# Patient Record
Sex: Male | Born: 1975 | Race: Black or African American | Hispanic: No | Marital: Married | Smoking: Never smoker
Health system: Southern US, Community
[De-identification: ages and names within clinical notes are randomized; demographics above are authoritative.]

## PROBLEM LIST (undated history)

## (undated) DIAGNOSIS — T7840XA Allergy, unspecified, initial encounter: Secondary | ICD-10-CM

## (undated) DIAGNOSIS — G43909 Migraine, unspecified, not intractable, without status migrainosus: Secondary | ICD-10-CM

## (undated) HISTORY — DX: Migraine, unspecified, not intractable, without status migrainosus: G43.909

## (undated) HISTORY — DX: Allergy, unspecified, initial encounter: T78.40XA

## (undated) HISTORY — PX: HERNIA REPAIR: SHX51

---

## 2010-10-18 ENCOUNTER — Ambulatory Visit: Payer: Self-pay | Admitting: Family Medicine

## 2011-04-28 ENCOUNTER — Emergency Department (HOSPITAL_COMMUNITY)
Admission: EM | Admit: 2011-04-28 | Discharge: 2011-04-28 | Disposition: A | Discharge status: Home / Self Care | Payer: BC Managed Care – PPO | Attending: Emergency Medicine | Admitting: Emergency Medicine

## 2011-04-28 DIAGNOSIS — R42 Dizziness and giddiness: Secondary | ICD-10-CM | POA: Insufficient documentation

## 2011-04-28 DIAGNOSIS — R112 Nausea with vomiting, unspecified: Secondary | ICD-10-CM | POA: Insufficient documentation

## 2011-04-28 LAB — POCT I-STAT, CHEM 8
BUN: 19 mg/dL (ref 6–23)
Calcium, Ion: 1.19 mmol/L (ref 1.12–1.32)
Creatinine, Ser: 0.9 mg/dL (ref 0.50–1.35)
TCO2: 26 mmol/L (ref 0–100)

## 2011-07-06 ENCOUNTER — Encounter: Payer: Self-pay | Admitting: Family Medicine

## 2013-07-21 ENCOUNTER — Emergency Department: Payer: Self-pay | Admitting: Emergency Medicine
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
## 2015-11-05 ENCOUNTER — Ambulatory Visit: Payer: Self-pay | Admitting: Physician Assistant

## 2015-11-17 ENCOUNTER — Other Ambulatory Visit: Payer: Self-pay

## 2015-11-18 ENCOUNTER — Ambulatory Visit: Payer: Self-pay | Admitting: Physician Assistant

## 2015-12-08 ENCOUNTER — Ambulatory Visit (INDEPENDENT_AMBULATORY_CARE_PROVIDER_SITE_OTHER): Payer: BLUE CROSS/BLUE SHIELD | Admitting: Physician Assistant

## 2015-12-08 ENCOUNTER — Encounter: Payer: Self-pay | Admitting: Physician Assistant

## 2015-12-08 VITALS — BP 122/70 | HR 80 | Temp 98.5°F | Resp 16 | Wt 222.0 lb

## 2015-12-08 DIAGNOSIS — G4710 Hypersomnia, unspecified: Secondary | ICD-10-CM | POA: Diagnosis not present

## 2015-12-08 DIAGNOSIS — R0683 Snoring: Secondary | ICD-10-CM | POA: Diagnosis not present

## 2015-12-08 DIAGNOSIS — F329 Major depressive disorder, single episode, unspecified: Secondary | ICD-10-CM | POA: Diagnosis not present

## 2015-12-08 DIAGNOSIS — R400 Somnolence: Secondary | ICD-10-CM

## 2015-12-08 DIAGNOSIS — Z0000 Encounter for general adult medical examination without abnormal findings: Secondary | ICD-10-CM

## 2015-12-08 DIAGNOSIS — F32A Depression, unspecified: Secondary | ICD-10-CM

## 2015-12-08 DIAGNOSIS — Z7189 Other specified counseling: Secondary | ICD-10-CM

## 2015-12-08 DIAGNOSIS — R454 Irritability and anger: Secondary | ICD-10-CM

## 2015-12-08 DIAGNOSIS — Z91013 Allergy to seafood: Secondary | ICD-10-CM

## 2015-12-08 DIAGNOSIS — Z7689 Persons encountering health services in other specified circumstances: Secondary | ICD-10-CM

## 2015-12-08 MED ORDER — SERTRALINE HCL 50 MG PO TABS: 50.0000 mg | ORAL_TABLET | Freq: Every day | ORAL | Status: DC

## 2015-12-08 NOTE — Patient Instructions (Signed)

## 2015-12-08 NOTE — Progress Notes (Signed)
Patient ID: Darren Mcintosh, male   DOB: 15-Dec-1975, 40 y.o.   MRN: [id number]       Patient: Darren Mcintosh Male    DOB: 11/15/1975   40 y.o.   MRN: [id number] Visit Date: 12/08/2015  Today's Provider: Margaretann Loveless, PA-C   Chief Complaint  Patient presents with  . New Patient (Initial Visit)   Subjective:    HPI New Patient Patient comes in today wanting to establish care with the practice. He feels well with no other complaints.  He is wanting to have lab work done to see if he still has a seafood allergy. He reports that his twin sister is allergic to seafood, and thinks he is too.  He has no other complaints and states that his wife is the one that pressured him to come to get his labs checked. His wife is a Engineer, civil (consulting).    Allergies  Allergen Reactions  . Penicillins   . Shellfish Allergy     Seafood allergy   Previous Medications   TERBINAFINE (LAMISIL) 250 MG TABLET    Reported on 12/08/2015    Review of Systems  Constitutional: Negative.   HENT: Negative.   Eyes: Negative.   Respiratory: Negative.   Cardiovascular: Negative.   Gastrointestinal: Negative.   Endocrine: Negative.   Genitourinary: Negative.   Musculoskeletal: Negative.   Skin: Negative.   Allergic/Immunologic: Negative.   Neurological: Negative.   Hematological: Negative.   Psychiatric/Behavioral: Negative.     Social History  Substance Use Topics  . Smoking status: Never Smoker   . Smokeless tobacco: Not on file  . Alcohol Use: 1.2 - 1.8 oz/week    2-3 Standard drinks or equivalent per week     Comment: ocassional    Objective:   BP 122/70 mmHg  Pulse 80  Temp(Src) 98.5 F (36.9 C)  Resp 16  Wt 222 lb (100.699 kg)  Physical Exam  Constitutional: He is oriented to person, place, and time. He appears well-developed and well-nourished.  HENT:  Head: Normocephalic and atraumatic.  Right Ear: External ear normal.  Left Ear: External ear normal.  Nose: Nose normal.    Mouth/Throat: Oropharynx is clear and moist.  Eyes: Conjunctivae and EOM are normal. Pupils are equal, round, and reactive to light. Right eye exhibits no discharge.  Neck: Normal range of motion. Neck supple. No tracheal deviation present. No thyromegaly present.  Cardiovascular: Normal rate, regular rhythm, normal heart sounds and intact distal pulses.   No murmur heard. Pulmonary/Chest: Effort normal and breath sounds normal. No respiratory distress. He has no wheezes. He has no rales. He exhibits no tenderness.  Abdominal: Soft. He exhibits no distension and no mass. There is no tenderness. There is no rebound and no guarding.  Musculoskeletal: Normal range of motion. He exhibits no edema or tenderness.  Lymphadenopathy:    He has no cervical adenopathy.  Neurological: He is alert and oriented to person, place, and time. He has normal reflexes. No cranial nerve deficit. He exhibits normal muscle tone. Coordination normal.  Skin: Skin is warm and dry. No rash noted. No erythema.  Psychiatric: He has a normal mood and affect. His behavior is normal. Judgment and thought content normal.        Assessment & Plan:     1. Annual physical exam Physical exam was normal today. We will check labs as below. I will follow-up with him pending these lab results. If labs are stable and within normal limits he will  not need to be seen or have labs rechecked for 1 year with his repeat annual physical exam. He is to call the office in the meantime if he has any acute issues, questions or concerns. - TSH - CBC with Differential - Comprehensive Metabolic Panel (CMET) - Lipid Profile  2. Establishing care with new doctor, encounter for No previous PCP.  3. 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 We'll check labs as below and follow-up pending  these results. - Food Mix (Seafoods) IgE  4. Depression States he has had some increasing anger and irritability which may be depression like symptoms. He states his twin sister had similar symptoms and was put on Zoloft and has been doing very well. We will try Zoloft as below. I will follow-up with him in 4 weeks to see how he is doing. He is to call the office if he has any adverse reactions, worsening symptoms, questions or concerns in the meantime. - sertraline (ZOLOFT) 50 MG tablet; Take 1 tablet (50 mg total) by mouth daily.  Dispense: 30 tablet; Refill: 1  5. Irritability See above medical treatment plan for #4. - sertraline (ZOLOFT) 50 MG tablet; Take 1 tablet (50 mg total) by mouth daily.  Dispense: 30 tablet; Refill: 1  6. Daytime somnolence He and his wife both voiced that he has daytime somnolence and fatigue. He does work third shift so this may be shift work disorder but his wife does report that he snores very loudly and she has witnessed apnea moments. She would like for him to have a sleep study to evaluate for obstructive sleep apnea. I will place an order for sleep study. I will follow-up with him pending the results of the sleep study as needed. - Ambulatory referral to Sleep Studies  7. Snoring See above medical treatment plan for #6. - Ambulatory referral to Sleep Studies       Margaretann Loveless, PA-C  [facility]

## 2016-01-05 ENCOUNTER — Ambulatory Visit: Payer: BLUE CROSS/BLUE SHIELD | Admitting: Physician Assistant

## 2016-01-07 ENCOUNTER — Ambulatory Visit: Payer: BLUE CROSS/BLUE SHIELD | Attending: Otolaryngology

## 2016-01-07 DIAGNOSIS — G4733 Obstructive sleep apnea (adult) (pediatric): Secondary | ICD-10-CM | POA: Insufficient documentation

## 2016-01-07 DIAGNOSIS — R0683 Snoring: Secondary | ICD-10-CM | POA: Diagnosis present

## 2016-01-19 ENCOUNTER — Ambulatory Visit: Payer: BLUE CROSS/BLUE SHIELD | Admitting: Physician Assistant

## 2016-02-01 ENCOUNTER — Encounter: Payer: Self-pay | Admitting: Physician Assistant

## 2016-02-01 ENCOUNTER — Emergency Department
Admission: EM | Admit: 2016-02-01 | Discharge: 2016-02-01 | Disposition: A | Discharge status: Home / Self Care | Payer: BLUE CROSS/BLUE SHIELD | Attending: Emergency Medicine | Admitting: Emergency Medicine

## 2016-02-01 ENCOUNTER — Encounter: Payer: Self-pay | Admitting: Emergency Medicine

## 2016-02-01 ENCOUNTER — Ambulatory Visit (INDEPENDENT_AMBULATORY_CARE_PROVIDER_SITE_OTHER): Payer: BLUE CROSS/BLUE SHIELD | Admitting: Physician Assistant

## 2016-02-01 VITALS — BP 120/80 | HR 77 | Temp 98.5°F | Resp 16 | Wt 229.4 lb

## 2016-02-01 DIAGNOSIS — R42 Dizziness and giddiness: Secondary | ICD-10-CM | POA: Insufficient documentation

## 2016-02-01 DIAGNOSIS — R55 Syncope and collapse: Secondary | ICD-10-CM

## 2016-02-01 DIAGNOSIS — R7309 Other abnormal glucose: Secondary | ICD-10-CM | POA: Diagnosis not present

## 2016-02-01 LAB — BASIC METABOLIC PANEL
Anion gap: 6 (ref 5–15)
BUN: 17 mg/dL (ref 6–20)
CALCIUM: 8.8 mg/dL — AB (ref 8.9–10.3)
CO2: 25 mmol/L (ref 22–32)
CREATININE: 1.09 mg/dL (ref 0.61–1.24)
Chloride: 105 mmol/L (ref 101–111)
GFR calc Af Amer: >60 mL/min (ref >60)
GLUCOSE: 140 mg/dL — AB (ref 65–99)
Potassium: 3.5 mmol/L (ref 3.5–5.1)
Sodium: 136 mmol/L (ref 135–145)

## 2016-02-01 LAB — POCT GLYCOSYLATED HEMOGLOBIN (HGB A1C): HEMOGLOBIN A1C: 6.2

## 2016-02-01 LAB — CBC
HCT: 43.9 % (ref 40.0–52.0)
Hemoglobin: 14.5 g/dL (ref 13.0–18.0)
MCH: 26 pg (ref 26.0–34.0)
MCHC: 33 g/dL (ref 32.0–36.0)
MCV: 78.8 fL — ABNORMAL LOW (ref 80.0–100.0)
Platelets: 197 10*3/uL (ref 150–440)
RBC: 5.58 MIL/uL (ref 4.40–5.90)
RDW: 13.6 % (ref 11.5–14.5)
WBC: 6.7 10*3/uL (ref 3.8–10.6)

## 2016-02-01 LAB — TROPONIN I

## 2016-02-01 NOTE — Patient Instructions (Signed)
Syncope  Syncope is a medical term for fainting or passing out. This means you lose consciousness and drop to the ground. People are generally unconscious for less than 5 minutes. You may have some muscle twitches for up to 15 seconds before waking up and returning to normal. Syncope occurs more often in older adults, but it can happen to anyone. While most causes of syncope are not dangerous, syncope can be a sign of a serious medical problem. It is important to seek medical care.   CAUSES   Syncope is caused by a sudden drop in blood flow to the brain. The specific cause is often not determined. Factors that can bring on syncope include:  · Taking medicines that lower blood pressure.  · Sudden changes in posture, such as standing up quickly.  · Taking more medicine than prescribed.  · Standing in one place for too long.  · Seizure disorders.  · Dehydration and excessive exposure to heat.  · Low blood sugar (hypoglycemia).  · Straining to have a bowel movement.  · Heart disease, irregular heartbeat, or other circulatory problems.  · Fear, emotional distress, seeing blood, or severe pain.  SYMPTOMS   Right before fainting, you may:  · Feel dizzy or light-headed.  · Feel nauseous.  · See all white or all black in your field of vision.  · Have cold, clammy skin.  DIAGNOSIS   Your health care provider will ask about your symptoms, perform a physical exam, and perform an electrocardiogram (ECG) to record the electrical activity of your heart. Your health care provider may also perform other heart or blood tests to determine the cause of your syncope which may include:  · Transthoracic echocardiogram (TTE). During echocardiography, sound waves are used to evaluate how blood flows through your heart.  · Transesophageal echocardiogram (TEE).  · Cardiac monitoring. This allows your health care provider to monitor your heart rate and rhythm in real time.  · Holter monitor. This is a portable device that records your  heartbeat and can help diagnose heart arrhythmias. It allows your health care provider to track your heart activity for several days, if needed.  · Stress tests by exercise or by giving medicine that makes the heart beat faster.  TREATMENT   In most cases, no treatment is needed. Depending on the cause of your syncope, your health care provider may recommend changing or stopping some of your medicines.  HOME CARE INSTRUCTIONS  · Have someone stay with you until you feel stable.  · Do not drive, use machinery, or play sports until your health care provider says it is okay.  · Keep all follow-up appointments as directed by your health care provider.  · Lie down right away if you start feeling like you might faint. Breathe deeply and steadily. Wait until all the symptoms have passed.  · Drink enough fluids to keep your urine clear or pale yellow.  · If you are taking blood pressure or heart medicine, get up slowly and take several minutes to sit and then stand. This can reduce dizziness.  SEEK IMMEDIATE MEDICAL CARE IF:   · You have a severe headache.  · You have unusual pain in the chest, abdomen, or back.  · You are bleeding from your mouth or rectum, or you have black or tarry stool.  · You have an irregular or very fast heartbeat.  · You have pain with breathing.  · You have repeated fainting or seizure-like jerking during an   episode.  · You faint when sitting or lying down.  · You have confusion.  · You have trouble walking.  · You have severe weakness.  · You have vision problems.  If you fainted, call your local emergency services (911 in [place] Do not drive yourself to the hospital.      This information is not intended to replace advice given to you by your health care provider. Make sure you discuss any questions you have with your health care provider.     Document Released: 10/30/2005 Document Revised: 03/16/2015 Document Reviewed: 12/29/2011  Elsevier Interactive Patient Education ©2016 [employer]vier  [employer]  Syncope, commonly known as fainting, is a temporary loss of consciousness. It occurs when the blood flow to the brain is reduced. Vasovagal syncope (also called neurocardiogenic syncope) is a fainting spell in which the blood flow to the brain is reduced because of a sudden drop in heart rate and blood pressure. Vasovagal syncope occurs when the brain and the cardiovascular system (blood vessels) do not adequately communicate and respond to each other. This is the most common cause of fainting. It often occurs in response to fear or some other type of emotional or physical stress. The body has a reaction in which the heart starts beating too slowly or the blood vessels expand, reducing blood pressure. This type of fainting spell is generally considered harmless. However, injuries can occur if a person takes a sudden fall during a fainting spell.   CAUSES   Vasovagal syncope occurs when a person's blood pressure and heart rate decrease suddenly, usually in response to a trigger. Many things and situations can trigger an episode. Some of these include:   · Pain.    · Fear.    · The sight of blood or medical procedures, such as blood being drawn from a vein.    · Common activities, such as coughing, swallowing, stretching, or going to the bathroom.    · Emotional stress.    · Prolonged standing, especially in a warm environment.    · Lack of sleep or rest.    · Prolonged lack of food.    · Prolonged lack of fluids.    · Recent illness.  · The use of certain drugs that affect blood pressure, such as cocaine, alcohol, marijuana, inhalants, and opiates.    SYMPTOMS   Before the fainting episode, you may:   · Feel dizzy or light headed.    · Become pale.  · Sense that you are going to faint.    · Feel like the room is spinning.    · Have tunnel vision, only seeing directly in front of you.    · Feel sick to your stomach (nauseous).    · See spots or slowly lose vision.    · Hear ringing in your ears.    · Have a  headache.    · Feel warm and sweaty.    · Feel a sensation of pins and needles.  During the fainting spell, you will generally be unconscious for no longer than a couple minutes before waking up and returning to normal. If you get up too quickly before your body can recover, you may faint again. Some twitching or jerky movements may occur during the fainting spell.   DIAGNOSIS   Your health care provider will ask about your symptoms, take a medical history, and perform a physical exam. Various tests may be done to rule out other causes of fainting. These may include blood tests and tests to check the heart, such as electrocardiography, echocardiography, and possibly an electrophysiology study.   When other causes have been ruled out, a test may be done to check the body's response to changes in position (tilt table test).  TREATMENT   Most cases of vasovagal syncope do not require treatment. Your health care provider may recommend ways to avoid fainting triggers and may provide home strategies for preventing fainting. If you must be exposed to a possible trigger, you can drink additional fluids to help reduce your chances of having an episode of vasovagal syncope. If you have warning signs of an oncoming episode, you can respond by positioning yourself favorably (lying down).  If your fainting spells continue, you may be given medicines to prevent fainting. Some medicines may help make you more resistant to repeated episodes of vasovagal syncope. Special exercises or compression stockings may be recommended. In rare cases, the surgical placement of a pacemaker is considered.  HOME CARE INSTRUCTIONS   · Learn to identify the warning signs of vasovagal syncope.    · Sit or lie down at the first warning sign of a fainting spell. If sitting, put your head down between your legs. If you lie down, swing your legs up in the air to increase blood flow to the brain.    · Avoid hot tubs and saunas.  · Avoid prolonged  standing.  · Drink enough fluids to keep your urine clear or pale yellow. Avoid caffeine.  · Increase salt in your diet as directed by your health care provider.    · If you have to stand for a long time, perform movements such as:      Crossing your legs.      Flexing and stretching your leg muscles.      Squatting.      Moving your legs.      Bending over.    · Only take over-the-counter or prescription medicines as directed by your health care provider. Do not suddenly stop any medicines without asking your health care provider first.   SEEK MEDICAL CARE IF:   · Your fainting spells continue or happen more frequently in spite of treatment.    · You lose consciousness for more than a couple minutes.  · You have fainting spells during or after exercising or after being startled.    · You have new symptoms that occur with the fainting spells, such as:      Shortness of breath.    Chest pain.      Irregular heartbeat.    · You have episodes of twitching or jerky movements that last longer than a few seconds.  · You have episodes of twitching or jerky movements without obvious fainting.  SEEK IMMEDIATE MEDICAL CARE IF:   · You have injuries or bleeding after a fainting spell.    · You have episodes of twitching or jerky movements that last longer than 5 minutes.    · You have more than one spell of twitching or jerky movements before returning to consciousness after fainting.     This information is not intended to replace advice given to you by your health care provider. Make sure you discuss any questions you have with your health care provider.     Document Released: 10/16/2012 Document Revised: 03/16/2015 Document Reviewed: 10/16/2012  Elsevier Interactive Patient Education ©2016 Elsevier Inc[employer]
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
## 2016-02-03 DIAGNOSIS — R7309 Other abnormal glucose: Secondary | ICD-10-CM | POA: Insufficient documentation

## 2016-02-04 ENCOUNTER — Ambulatory Visit: Payer: BLUE CROSS/BLUE SHIELD

## 2016-02-04 ENCOUNTER — Ambulatory Visit: Payer: BLUE CROSS/BLUE SHIELD | Attending: Otolaryngology

## 2016-02-04 DIAGNOSIS — G4733 Obstructive sleep apnea (adult) (pediatric): Secondary | ICD-10-CM | POA: Diagnosis not present

## 2016-02-04 DIAGNOSIS — R0683 Snoring: Secondary | ICD-10-CM | POA: Insufficient documentation

## 2016-02-07 ENCOUNTER — Ambulatory Visit: Payer: BLUE CROSS/BLUE SHIELD | Attending: Physician Assistant

## 2016-02-11 ENCOUNTER — Encounter: Payer: Self-pay | Admitting: Physician Assistant

## 2016-02-23 ENCOUNTER — Encounter: Payer: Self-pay | Admitting: Physician Assistant

## 2016-03-02 ENCOUNTER — Ambulatory Visit: Payer: Self-pay | Admitting: Physician Assistant

## 2016-07-18 ENCOUNTER — Encounter: Payer: Self-pay | Admitting: Family Medicine

## 2016-07-18 ENCOUNTER — Ambulatory Visit (INDEPENDENT_AMBULATORY_CARE_PROVIDER_SITE_OTHER): Payer: No Typology Code available for payment source | Admitting: Family Medicine

## 2016-07-18 VITALS — BP 106/70 | HR 94 | Temp 98.2°F | Resp 16 | Wt 213.8 lb

## 2016-07-18 DIAGNOSIS — R21 Rash and other nonspecific skin eruption: Secondary | ICD-10-CM

## 2016-07-18 DIAGNOSIS — B354 Tinea corporis: Secondary | ICD-10-CM

## 2016-07-18 MED ORDER — TRIAMCINOLONE ACETONIDE 0.1 % EX CREA: 1.0000 "application " | TOPICAL_CREAM | Freq: Two times a day (BID) | CUTANEOUS | 0 refills | Status: AC

## 2016-07-18 MED ORDER — TERBINAFINE HCL 250 MG PO TABS: 250.0000 mg | ORAL_TABLET | Freq: Every day | ORAL | 0 refills | Status: AC

## 2016-07-18 NOTE — Progress Notes (Signed)
Subjective:     Patient ID: Darren Mcintosh, male   DOB: 01/24/1976, 40 y.o.   MRN: [id number]  HPI  Chief Complaint  Patient presents with  . Rash    Patient comes into office today with concerns of a possible rash or lesion on both of his legs for the past month and half.   . Skin Problem    Patient would like to address bumps that have came about 2 weeks ago, he describes bumps on arm as itchy.   States his daughter has exposed him to ringworm and wishes retreatment with Lamisil which has worked in the past.    Review of Systems     Objective:   Physical Exam  Constitutional: He appears well-developed and well-nourished. No distress.  Skin:  Bilateral thighs with patches of scaly annular rash  Left forearm has 3 skin colored 2-3 mm.papules. No vesicles or pustules noted       Assessment:    1. Tinea corporis - terbinafine (LAMISIL) 250 MG tablet; Take 1 tablet (250 mg total) by mouth daily. Reported on 02/01/2016  Dispense: 30 tablet; Refill: 0  2. Rash - triamcinolone cream (KENALOG) 0.1 %; Apply 1 application topically 2 (two) times daily. To itchy rash on arms  Dispense: 30 g; Refill: 0    Plan:    Further f/u if not improving.

## 2016-07-18 NOTE — Patient Instructions (Signed)
Let us know if new symptoms or not improving. 

## 2017-01-26 ENCOUNTER — Encounter: Payer: No Typology Code available for payment source | Admitting: Physician Assistant

## 2017-02-02 ENCOUNTER — Encounter: Payer: No Typology Code available for payment source | Admitting: Physician Assistant

## 2017-02-06 ENCOUNTER — Encounter: Payer: No Typology Code available for payment source | Admitting: Physician Assistant

## 2017-02-06 NOTE — Progress Notes (Deleted)
Patient: Darren Mcintosh, Male    DOB: 12/03/1975, 41 y.o.   MRN: [id number] Visit Date: 02/06/2017  Today's Provider: Margaretann LovelessJennifer M Burnette, PA-C   No chief complaint on file.  Subjective:    Annual physical exam Darren Johnnye LanaDavid Mcintosh is a 41 y.o. male who presents today for health maintenance and complete physical. He feels {DESC; WELL/FAIRLY WELL/POORLY:18703}. He reports exercising ***. He reports he is sleeping {DESC; WELL/FAIRLY WELL/POORLY:18703}.  -----------------------------------------------------------------   Review of Systems  Constitutional: Negative.   HENT: Negative.   Eyes: Negative.   Respiratory: Negative.   Cardiovascular: Negative.   Gastrointestinal: Negative.   Endocrine: Negative.   Genitourinary: Negative.   Musculoskeletal: Negative.   Skin: Negative.   Allergic/Immunologic: Negative.   Hematological: Negative.   Psychiatric/Behavioral: Negative.     Social History      He  reports that he has never smoked. He does not have any smokeless tobacco history on file. He reports that he drinks about 1.2 - 1.8 oz of alcohol per week . He reports that he does not use drugs.       Social History   Social History  . Marital status: Married    Spouse name: N/A  . Number of children: N/A  . Years of education: N/A   Social History Main Topics  . Smoking status: Never Smoker  . Smokeless tobacco: Not on file  . Alcohol use 1.2 - 1.8 oz/week    2 - 3 Standard drinks or equivalent per week     Comment: ocassional   . Drug use: No  . Sexual activity: Not on file   Other Topics Concern  . Not on file   Social History Narrative  . No narrative on file    Past Medical History:  Diagnosis Date  . Allergy   . Migraines      Patient Active Problem List   Diagnosis Date Noted  . Elevated hemoglobin A1c 02/03/2016    Past Surgical History:  Procedure Laterality Date  . HERNIA REPAIR  1990s    Family History        Family Status    Relation Status  . Mother Deceased  . Father Deceased  . Sister Alive  . Brother Alive        His family history includes Asthma in his sister; Cancer in his father; Hypertension in his mother.     Allergies  Allergen Reactions  . Penicillins   . Shellfish Allergy     Seafood allergy     Current Outpatient Prescriptions:  .  terbinafine (LAMISIL) 250 MG tablet, Take 1 tablet (250 mg total) by mouth daily. Reported on 02/01/2016, Disp: 30 tablet, Rfl: 0 .  triamcinolone cream (KENALOG) 0.1 %, Apply 1 application topically 2 (two) times daily. To itchy rash on arms, Disp: 30 g, Rfl: 0   Patient Care Team: Margaretann LovelessJennifer M Burnette, PA-C as PCP - General (Family Medicine)      Objective:   Vitals: There were no vitals taken for this visit.  There were no vitals filed for this visit.   Physical Exam   Depression Screen No flowsheet data found.    Assessment & Plan:     Routine Health Maintenance and Physical Exam  Exercise Activities and Dietary recommendations Goals    None      Immunization History  Administered Date(s) Administered  . Influenza,inj,Quad PF,36+ Mos 08/16/2015  . Tdap 10/18/2010    Health Maintenance  Topic Date Due  . HIV Screening  08/13/1992  . INFLUENZA VACCINE  06/13/2016  . TETANUS/TDAP  10/18/2020     Discussed health benefits of physical activity, and encouraged him to engage in regular exercise appropriate for his age and condition.    --------------------------------------------------------------------    Margaretann Loveless, PA-C  [facility]

## 2017-02-07 ENCOUNTER — Encounter: Payer: Self-pay | Admitting: Physician Assistant

## 2017-02-07 ENCOUNTER — Ambulatory Visit (INDEPENDENT_AMBULATORY_CARE_PROVIDER_SITE_OTHER): Payer: Managed Care, Other (non HMO) | Admitting: Physician Assistant

## 2017-02-07 VITALS — BP 120/70 | HR 83 | Temp 98.1°F | Resp 16 | Ht 69.0 in | Wt 216.2 lb

## 2017-02-07 DIAGNOSIS — Z114 Encounter for screening for human immunodeficiency virus [HIV]: Secondary | ICD-10-CM

## 2017-02-07 DIAGNOSIS — Z0000 Encounter for general adult medical examination without abnormal findings: Secondary | ICD-10-CM | POA: Diagnosis not present

## 2017-02-07 DIAGNOSIS — Z008 Encounter for other general examination: Secondary | ICD-10-CM

## 2017-02-07 DIAGNOSIS — Z0189 Encounter for other specified special examinations: Secondary | ICD-10-CM

## 2017-02-07 NOTE — Patient Instructions (Signed)
 Health Maintenance, Male A healthy lifestyle and preventive care is important for your health and wellness. Ask your health care provider about what schedule of regular examinations is right for you. What should I know about weight and diet?  Eat a Healthy Diet  Eat plenty of vegetables, fruits, whole grains, low-fat dairy products, and lean protein.  Do not eat a lot of foods high in solid fats, added sugars, or salt. Maintain a Healthy Weight  Regular exercise can help you achieve or maintain a healthy weight. You should:  Do at least 150 minutes of exercise each week. The exercise should increase your heart rate and make you sweat (moderate-intensity exercise).  Do strength-training exercises at least twice a week. Watch Your Levels of Cholesterol and Blood Lipids  Have your blood tested for lipids and cholesterol every 5 years starting at 40 years of age. If you are at high risk for heart disease, you should start having your blood tested when you are 40 years old. You may need to have your cholesterol levels checked more often if:  Your lipid or cholesterol levels are high.  You are older than 40 years of age.  You are at high risk for heart disease. What should I know about cancer screening? Many types of cancers can be detected early and may often be prevented. Lung Cancer  You should be screened every year for lung cancer if:  You are a current smoker who has smoked for at least 30 years.  You are a former smoker who has quit within the past 15 years.  Talk to your health care provider about your screening options, when you should start screening, and how often you should be screened. Colorectal Cancer  Routine colorectal cancer screening usually begins at 40 years of age and should be repeated every 5-10 years until you are 40 years old. You may need to be screened more often if early forms of precancerous polyps or small growths are found. Your health care provider  may recommend screening at an earlier age if you have risk factors for colon cancer.  Your health care provider may recommend using home test kits to check for hidden blood in the stool.  A small camera at the end of a tube can be used to examine your colon (sigmoidoscopy or colonoscopy). This checks for the earliest forms of colorectal cancer. Prostate and Testicular Cancer  Depending on your age and overall health, your health care provider may do certain tests to screen for prostate and testicular cancer.  Talk to your health care provider about any symptoms or concerns you have about testicular or prostate cancer. Skin Cancer  Check your skin from head to toe regularly.  Tell your health care provider about any new moles or changes in moles, especially if:  There is a change in a mole's size, shape, or color.  You have a mole that is larger than a pencil eraser.  Always use sunscreen. Apply sunscreen liberally and repeat throughout the day.  Protect yourself by wearing long sleeves, pants, a wide-brimmed hat, and sunglasses when outside. What should I know about heart disease, diabetes, and high blood pressure?  If you are 18-39 years of age, have your blood pressure checked every 3-5 years. If you are 40 years of age or older, have your blood pressure checked every year. You should have your blood pressure measured twice-once when you are at a hospital or clinic, and once when you are not at   a hospital or clinic. Record the average of the two measurements. To check your blood pressure when you are not at a hospital or clinic, you can use:  An automated blood pressure machine at a pharmacy.  A home blood pressure monitor.  Talk to your health care provider about your target blood pressure.  If you are between 45-79 years old, ask your health care provider if you should take aspirin to prevent heart disease.  Have regular diabetes screenings by checking your fasting blood sugar  level.  If you are at a normal weight and have a low risk for diabetes, have this test once every three years after the age of 45.  If you are overweight and have a high risk for diabetes, consider being tested at a younger age or more often.  A one-time screening for abdominal aortic aneurysm (AAA) by ultrasound is recommended for men aged 65-75 years who are current or former smokers. What should I know about preventing infection? Hepatitis B  If you have a higher risk for hepatitis B, you should be screened for this virus. Talk with your health care provider to find out if you are at risk for hepatitis B infection. Hepatitis C  Blood testing is recommended for:  Everyone born from 1945 through 1965.  Anyone with known risk factors for hepatitis C. Sexually Transmitted Diseases (STDs)  You should be screened each year for STDs including gonorrhea and chlamydia if:  You are sexually active and are younger than 40 years of age.  You are older than 40 years of age and your health care provider tells you that you are at risk for this type of infection.  Your sexual activity has changed since you were last screened and you are at an increased risk for chlamydia or gonorrhea. Ask your health care provider if you are at risk.  Talk with your health care provider about whether you are at high risk of being infected with HIV. Your health care provider may recommend a prescription medicine to help prevent HIV infection. What else can I do?  Schedule regular health, dental, and eye exams.  Stay current with your vaccines (immunizations).  Do not use any tobacco products, such as cigarettes, chewing tobacco, and e-cigarettes. If you need help quitting, ask your health care provider.  Limit alcohol intake to no more than 2 drinks per day. One drink equals 12 ounces of beer, 5 ounces of wine, or 1 ounces of hard liquor.  Do not use street drugs.  Do not share needles.  Ask your health  care provider for help if you need support or information about quitting drugs.  Tell your health care provider if you often feel depressed.  Tell your health care provider if you have ever been abused or do not feel safe at home. This information is not intended to replace advice given to you by your health care provider. Make sure you discuss any questions you have with your health care provider. Document Released: 04/27/2008 Document Revised: 06/28/2016 Document Reviewed: 08/03/2015 Elsevier Interactive Patient Education  2017 Elsevier Inc.  

## 2017-02-07 NOTE — Progress Notes (Signed)
Patient: Darren Mcintosh, Male    DOB: 08-Sep-1976, 41 y.o.   MRN: [id number] Visit Date: 02/07/2017  Today's Provider: Margaretann Loveless, PA-C   Chief Complaint  Patient presents with  . Annual Exam   Subjective:    Annual physical exam Darren Mcintosh is a 41 y.o. male who presents today for health maintenance and complete physical. He feels well. He reports exercising mostly every day. He reports he is sleeping well with the CPAP machine. He is using this nightly and states it is helping his sleep.  -----------------------------------------------------------------   Review of Systems  Constitutional: Negative.   HENT: Negative.   Eyes: Negative.   Respiratory: Negative.   Cardiovascular: Negative.   Gastrointestinal: Negative.   Endocrine: Negative.   Genitourinary: Negative.   Musculoskeletal: Negative.   Skin: Negative.   Allergic/Immunologic: Negative.   Neurological: Negative.   Hematological: Negative.   Psychiatric/Behavioral: Negative.     Social History      He  reports that he has never smoked. He has never used smokeless tobacco. He reports that he drinks about 1.2 - 1.8 oz of alcohol per week . He reports that he does not use drugs.       Social History   Social History  . Marital status: Married    Spouse name: N/A  . Number of children: N/A  . Years of education: N/A   Social History Main Topics  . Smoking status: Never Smoker  . Smokeless tobacco: Never Used  . Alcohol use 1.2 - 1.8 oz/week    2 - 3 Standard drinks or equivalent per week     Comment: ocassional   . Drug use: No  . Sexual activity: Not Asked   Other Topics Concern  . None   Social History Narrative  . None    Past Medical History:  Diagnosis Date  . Allergy   . Migraines      Patient Active Problem List   Diagnosis Date Noted  . Elevated hemoglobin A1c 02/03/2016    Past Surgical History:  Procedure Laterality Date  . HERNIA REPAIR  1990s     Family History        Family Status  Relation Status  . Mother Deceased  . Father Deceased  . Sister Alive  . Brother Alive        His family history includes Asthma in his sister; Cancer in his father; Hypertension in his mother.     Allergies  Allergen Reactions  . Penicillins   . Shellfish Allergy     Seafood allergy     Current Outpatient Prescriptions:  .  terbinafine (LAMISIL) 250 MG tablet, Take 1 tablet (250 mg total) by mouth daily. Reported on 02/01/2016, Disp: 30 tablet, Rfl: 0 .  triamcinolone cream (KENALOG) 0.1 %, Apply 1 application topically 2 (two) times daily. To itchy rash on arms, Disp: 30 g, Rfl: 0   Patient Care Team: Margaretann Loveless, PA-C as PCP - General (Family Medicine)      Objective:   Vitals: BP 120/70 (BP Location: Right Arm, Patient Position: Sitting, Cuff Size: Large)   Pulse 83   Temp 98.1 F (36.7 C) (Oral)   Resp 16   Ht 5\' 9"  (1.753 m)   Wt 216 lb 3.2 oz (98.1 kg)   SpO2 96% Comment: 96%-95%  BMI 31.93 kg/m      Physical Exam  Constitutional: He is oriented to person, place, and time.  He appears well-developed and well-nourished.  HENT:  Head: Normocephalic and atraumatic.  Right Ear: Hearing, tympanic membrane, external ear and ear canal normal.  Left Ear: Hearing, tympanic membrane, external ear and ear canal normal.  Nose: Nose normal.  Mouth/Throat: Uvula is midline, oropharynx is clear and moist and mucous membranes are normal.  Eyes: Conjunctivae and EOM are normal. Pupils are equal, round, and reactive to light. Right eye exhibits no discharge.  Neck: Normal range of motion. Neck supple. Carotid bruit is not present. No tracheal deviation present. No thyromegaly present.  Cardiovascular: Normal rate, regular rhythm, normal heart sounds and intact distal pulses.   No murmur heard. Pulmonary/Chest: Effort normal and breath sounds normal. No respiratory distress. He has no decreased breath sounds. He has no  wheezes. He has no rales. He exhibits no tenderness.  Abdominal: Soft. He exhibits no distension and no mass. There is no tenderness. There is no rebound and no guarding.  Musculoskeletal: Normal range of motion. He exhibits no edema or tenderness.  Lymphadenopathy:    He has no cervical adenopathy.  Neurological: He is alert and oriented to person, place, and time. He has normal reflexes. No cranial nerve deficit. He exhibits normal muscle tone. Coordination normal.  Skin: Skin is warm and dry. No rash noted. No erythema.  Psychiatric: He has a normal mood and affect. His behavior is normal. Judgment and thought content normal.  Vitals reviewed.    Depression Screen PHQ 2/9 Scores 02/07/2017  PHQ - 2 Score 0  PHQ- 9 Score 0      Assessment & Plan:     Routine Health Maintenance and Physical Exam  Exercise Activities and Dietary recommendations Goals    None      Immunization History  Administered Date(s) Administered  . Influenza,inj,Quad PF,36+ Mos 08/16/2015  . Tdap 10/18/2010    Health Maintenance  Topic Date Due  . HIV Screening  08/13/1992  . INFLUENZA VACCINE  06/13/2016  . TETANUS/TDAP  10/18/2020     Discussed health benefits of physical activity, and encouraged him to engage in regular exercise appropriate for his age and condition.    1. Annual physical exam Normal physical exam today. Will check labs as below and f/u pending lab results. If labs are stable and WNL he will not need to have these rechecked for one year at his next annual physical exam. He is to call the office in the meantime if she has any acute issue, questions or concerns. - CBC with Differential/Platelet - Comprehensive metabolic panel - TSH  2. Encounter for biometric screening Form will be completed once labs received.  - Comprehensive metabolic panel - Hemoglobin A1c - Lipid panel  3. Encounter for screening for HIV - HIV antibody (with  reflex)  --------------------------------------------------------------------    Margaretann LovelessJennifer M Burnette, PA-C  [facility]

## 2017-02-08 ENCOUNTER — Telehealth: Payer: Self-pay

## 2017-02-08 LAB — CBC WITH DIFFERENTIAL/PLATELET
Basophils Absolute: 0 10*3/uL (ref 0.0–0.2)
Basos: 0 %
EOS (ABSOLUTE): 0.1 10*3/uL (ref 0.0–0.4)
EOS: 2 %
Hematocrit: 45.2 % (ref 37.5–51.0)
Hemoglobin: 15.1 g/dL (ref 13.0–17.7)
IMMATURE GRANULOCYTES: 0 %
Immature Grans (Abs): 0 10*3/uL (ref 0.0–0.1)
Lymphocytes Absolute: 1.8 10*3/uL (ref 0.7–3.1)
Lymphs: 28 %
MCH: 26.9 pg (ref 26.6–33.0)
MCHC: 33.4 g/dL (ref 31.5–35.7)
MCV: 80 fL (ref 79–97)
Monocytes Absolute: 0.6 10*3/uL (ref 0.1–0.9)
Monocytes: 9 %
NEUTROS PCT: 61 %
Neutrophils Absolute: 3.9 10*3/uL (ref 1.4–7.0)
PLATELETS: 237 10*3/uL (ref 150–379)
RBC: 5.62 x10E6/uL (ref 4.14–5.80)
RDW: 14.1 % (ref 12.3–15.4)
WBC: 6.4 10*3/uL (ref 3.4–10.8)

## 2017-02-08 LAB — HEMOGLOBIN A1C
ESTIMATED AVERAGE GLUCOSE: 134 mg/dL
Hgb A1c MFr Bld: 6.3 % — ABNORMAL HIGH (ref 4.8–5.6)

## 2017-02-08 LAB — COMPREHENSIVE METABOLIC PANEL
ALT: 28 IU/L (ref 0–44)
AST: 17 IU/L (ref 0–40)
Albumin/Globulin Ratio: 1.9 (ref 1.2–2.2)
Albumin: 4.3 g/dL (ref 3.5–5.5)
Alkaline Phosphatase: 70 IU/L (ref 39–117)
BUN/Creatinine Ratio: 13 (ref 9–20)
BUN: 14 mg/dL (ref 6–20)
Bilirubin Total: 0.5 mg/dL (ref 0.0–1.2)
CALCIUM: 9.1 mg/dL (ref 8.7–10.2)
CO2: 24 mmol/L (ref 18–29)
CREATININE: 1.04 mg/dL (ref 0.76–1.27)
Chloride: 101 mmol/L (ref 96–106)
GFR calc Af Amer: 104 mL/min/{1.73_m2} (ref >59)
GFR calc non Af Amer: 90 mL/min/{1.73_m2} (ref >59)
Globulin, Total: 2.3 g/dL (ref 1.5–4.5)
Glucose: 105 mg/dL — ABNORMAL HIGH (ref 65–99)
POTASSIUM: 4.1 mmol/L (ref 3.5–5.2)
Sodium: 139 mmol/L (ref 134–144)
Total Protein: 6.6 g/dL (ref 6.0–8.5)

## 2017-02-08 LAB — TSH: TSH: 1.7 u[IU]/mL (ref 0.450–4.500)

## 2017-02-08 LAB — LIPID PANEL
CHOLESTEROL TOTAL: 168 mg/dL (ref 100–199)
Chol/HDL Ratio: 3.4 ratio units (ref 0.0–5.0)
HDL: 50 mg/dL (ref >39)
LDL Calculated: 90 mg/dL (ref 0–99)
Triglycerides: 139 mg/dL (ref 0–149)
VLDL CHOLESTEROL CAL: 28 mg/dL (ref 5–40)

## 2017-02-08 LAB — HIV ANTIBODY (ROUTINE TESTING W REFLEX): HIV SCREEN 4TH GENERATION: NONREACTIVE

## 2017-02-08 NOTE — Telephone Encounter (Signed)
Patient advised as directed below.  Thanks,  -Joseline 

## 2017-02-08 NOTE — Telephone Encounter (Signed)
-----   Message from Margaretann LovelessJennifer M Burnette, [place]PA-C sent at 02/08/2017 10:03 AM EDT ----- Labs are stable and WNL with exception of A1c that is slightly elevated at 6.3 and up from 6.2 last year. This still indicates prediabetes. 6.5 is diabetic range. Continue to work on healthy lifestyle modifications and limit carbohydrates and sugars from diet. Continue increasing physical activity and try to get in 150 min of moderate activity per week. We will recheck next year. Biometric form completed and will be faxed.

## 2018-04-29 ENCOUNTER — Other Ambulatory Visit: Payer: Self-pay

## 2018-04-29 ENCOUNTER — Emergency Department: Payer: Worker's Compensation

## 2018-04-29 ENCOUNTER — Emergency Department
Admission: EM | Admit: 2018-04-29 | Discharge: 2018-04-29 | Disposition: A | Discharge status: Home / Self Care | Payer: Worker's Compensation | Attending: Emergency Medicine | Admitting: Emergency Medicine

## 2018-04-29 DIAGNOSIS — M25561 Pain in right knee: Secondary | ICD-10-CM | POA: Diagnosis not present

## 2018-04-29 MED ORDER — LIDOCAINE 5 % EX PTCH: 1.0000 | MEDICATED_PATCH | Freq: Two times a day (BID) | CUTANEOUS | 0 refills | Status: AC

## 2018-04-29 MED ORDER — TRAMADOL HCL 50 MG PO TABS: 50.0000 mg | ORAL_TABLET | Freq: Four times a day (QID) | ORAL | 0 refills | Status: DC | PRN

## 2018-04-29 MED ORDER — LIDOCAINE 5 % EX PTCH
1.0000 | MEDICATED_PATCH | CUTANEOUS | Status: DC
  Administered 2018-04-29: 1 via TRANSDERMAL
  Filled 2018-04-29: qty 1

## 2018-04-29 MED ORDER — TRAMADOL HCL 50 MG PO TABS
50.0000 mg | ORAL_TABLET | Freq: Once | ORAL | Status: AC
  Administered 2018-04-29: 50 mg via ORAL
  Filled 2018-04-29: qty 1

## 2018-04-29 NOTE — ED Provider Notes (Signed)
[facility] Emergency Department Provider Note   ____________________________________________   First MD Initiated Contact with Patient 04/29/18 [phone]     (approximate)  I have reviewed the triage vital signs and the nursing notes.   HISTORY  Chief Complaint Knee Pain    HPI Paco Johnnye LanaDavid Mcclafferty is a 42 y.o. male who comes into the hospital today after being injured at work.  The patient states that he went into work tonight to help out as it was his day off.  He states that he was helping someone load the forklift when they made a mistake loading the rack.  He reports that they accidentally pushed the metal part of the rack and it hit the patient's right knee.  He experienced some significant pain initially but rates his pain is currently a 5 out of 10 in intensity.  He reports it is painful and throbbing.  He iced it while in the ambulance but states he still in pain currently.  He denies any numbness or tingling.  He is here today for evaluation.   Past Medical History:  Diagnosis Date  . Allergy   . Migraines     Patient Active Problem List   Diagnosis Date Noted  . Elevated hemoglobin A1c 02/03/2016    Past Surgical History:  Procedure Laterality Date  . HERNIA REPAIR  1990s    Prior to Admission medications   Medication Sig Start Date End Date Taking? Authorizing Provider  lidocaine (LIDODERM) 5 % Place 1 patch onto the skin every 12 (twelve) hours. Remove & Discard patch within 12 hours or as directed by MD 04/29/18 04/29/19  Rebecka ApleyWebster, Frederik Standley P, MD  terbinafine (LAMISIL) 250 MG tablet Take 1 tablet (250 mg total) by mouth daily. Reported on 02/01/2016 07/18/16   Anola Gurneyhauvin, Robert, PA  traMADol (ULTRAM) 50 MG tablet Take 1 tablet (50 mg total) by mouth every 6 (six) hours as needed. 04/29/18   Rebecka ApleyWebster, Norina Cowper P, MD  triamcinolone cream (KENALOG) 0.1 % Apply 1 application topically 2 (two) times daily. To itchy rash on arms 07/18/16   Anola Gurneyhauvin, Robert, PA      Allergies Penicillins and Shellfish allergy  Family History  Problem Relation Age of Onset  . Hypertension Mother   . Cancer Father   . Asthma Sister     Social History Social History   Tobacco Use  . Smoking status: Never Smoker  . Smokeless tobacco: Never Used  Substance Use Topics  . Alcohol use: Yes    Alcohol/week: 1.2 - 1.8 oz    Types: 2 - 3 Standard drinks or equivalent per week    Comment: ocassional   . Drug use: No    Review of Systems  Constitutional: No fever/chills Eyes: No visual changes. ENT: No sore throat. Cardiovascular: Denies chest pain. Respiratory: Denies shortness of breath. Gastrointestinal: No abdominal pain.   Genitourinary: Negative for dysuria. Musculoskeletal: Right knee pain Skin: Negative for rash. Neurological: Negative for headaches   ____________________________________________   PHYSICAL EXAM:  VITAL SIGNS: ED Triage Vitals  Enc Vitals Group     BP 04/29/18 0306 130/80     Pulse Rate 04/29/18 0306 82     Resp 04/29/18 0306 18     Temp 04/29/18 0306 98.7 F (37.1 C)     Temp Source 04/29/18 0306 Oral     SpO2 04/29/18 0306 97 %     Weight 04/29/18 0304 215 lb (97.5 kg)     Height 04/29/18 0304 5'  9" (1.753 m)     Head Circumference --      Peak Flow --      Pain Score 04/29/18 0304 6     Pain Loc --      Pain Edu? --      Excl. in GC? --     Constitutional: Alert and oriented. Well appearing and in moderate distress. Eyes: Conjunctivae are normal. PERRL. EOMI. Head: Atraumatic. Nose: No congestion/rhinnorhea. Mouth/Throat: Mucous membranes are moist.  Oropharynx non-erythematous. Cardiovascular: Normal rate, regular rhythm. Grossly normal heart sounds.  Good peripheral circulation. Respiratory: Normal respiratory effort.  No retractions. Lungs CTAB. Gastrointestinal: Soft and nontender. No distention.  Positive bowel sounds Musculoskeletal: Tenderness to palpation of right knee, no effusion, bruising or  swelling.  Patient has pain with passive range of motion extension valgus and varus stressing. Neurologic:  Normal speech and language.  Skin:  Skin is warm, dry and intact.  Psychiatric: Mood and affect are normal.   ____________________________________________   LABS (all labs ordered are listed, but only abnormal results are displayed)  Labs Reviewed - No data to display ____________________________________________  EKG  none ____________________________________________  RADIOLOGY  ED MD interpretation: Right knee x-ray: Negative  Official radiology report(s): Dg Knee Complete 4 Views Right  Result Date: 04/29/2018 CLINICAL DATA:  Injury to the right knee EXAM: RIGHT KNEE - COMPLETE 4+ VIEW COMPARISON:  None. FINDINGS: No evidence of fracture, dislocation, or joint effusion. No evidence of arthropathy or other focal bone abnormality. Soft tissues are unremarkable. IMPRESSION: Negative. Electronically Signed   By: Jasmine Pang M.D.   On: 04/29/2018 03:48    ____________________________________________   PROCEDURES  Procedure(s) performed: None  Procedures  Critical Care performed: No  ____________________________________________   INITIAL IMPRESSION / ASSESSMENT AND PLAN / ED COURSE  As part of my medical decision making, I reviewed the following data within the electronic MEDICAL RECORD NUMBER Notes from prior ED visits and [place] Controlled Substance Database   This is a 42 year old male who comes into the hospital today with some right knee pain after he was hit by a metal forklift.  The patient did receive an x-ray which did not show any fracture.  I will give the patient some tramadol and a Lidoderm patch for his knee given his pain.  He will also receive a knee immobilizer and some crutches.  The patient should follow-up with orthopedic surgery for further evaluation of his knee pain.  He has no other complaints or concerns at this time.  He will be discharged  home.      ____________________________________________   FINAL CLINICAL IMPRESSION(S) / ED DIAGNOSES  Final diagnoses:  Acute pain of right knee     ED Discharge Orders        Ordered    traMADol (ULTRAM) 50 MG tablet  Every 6 hours PRN     04/29/18 0536    lidocaine (LIDODERM) 5 %  Every 12 hours     04/29/18 0536       Note:  This document was prepared using Dragon voice recognition software and may include unintentional dictation errors.    Rebecka Apley, MD 04/29/18 [phone]

## 2018-04-29 NOTE — ED Triage Notes (Signed)
Reports he was working on a machine and another person on a fork lift pushed the machine and it hit his knee.

## 2018-04-29 NOTE — ED Triage Notes (Signed)
First Nurse: patient brought in by ems from work. Per EMS a fork lift pushed a piece of equipment into his right knee. Patient with complaint of knee pain. Per EMS vital signs stable.

## 2018-04-29 NOTE — ED Notes (Signed)
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 Unable to complete rest of assessment at this time d/t pt answering phone.

## 2018-04-29 NOTE — Discharge Instructions (Signed)
Please follow up with orthopedic surgery for further evaluation of your knee pain. Please return with any other concerns

## 2018-05-06 ENCOUNTER — Telehealth: Payer: Self-pay | Admitting: *Deleted

## 2018-05-06 NOTE — Telephone Encounter (Signed)
Please Review.  Thanks,  -Joseline 

## 2018-05-06 NOTE — Telephone Encounter (Signed)
Unfortunately he established with [facility]. Office policy is once you have left you cannot re-establish and I am unfortunately not taking new patients at this time.

## 2018-05-06 NOTE — Telephone Encounter (Signed)
Patient advised as directed below.  Thanks,  -Joseline 

## 2018-05-06 NOTE — Telephone Encounter (Signed)
[facility] Please advise. Patient would like a cb today to let him know if she is willing to see him again. Thanks!

## 2018-12-16 ENCOUNTER — Other Ambulatory Visit: Payer: Self-pay

## 2018-12-16 ENCOUNTER — Emergency Department
Admission: EM | Admit: 2018-12-16 | Discharge: 2018-12-16 | Discharge status: Against Medical Advice | Payer: BLUE CROSS/BLUE SHIELD | Attending: Emergency Medicine | Admitting: Emergency Medicine

## 2018-12-16 ENCOUNTER — Encounter: Payer: Self-pay | Admitting: Emergency Medicine

## 2018-12-16 ENCOUNTER — Emergency Department: Payer: BLUE CROSS/BLUE SHIELD

## 2018-12-16 DIAGNOSIS — Z5321 Procedure and treatment not carried out due to patient leaving prior to being seen by health care provider: Secondary | ICD-10-CM | POA: Diagnosis not present

## 2018-12-16 DIAGNOSIS — R079 Chest pain, unspecified: Secondary | ICD-10-CM | POA: Diagnosis not present

## 2018-12-16 LAB — BASIC METABOLIC PANEL
Anion gap: 4 — ABNORMAL LOW (ref 5–15)
BUN: 20 mg/dL (ref 6–20)
CO2: 27 mmol/L (ref 22–32)
Calcium: 8.9 mg/dL (ref 8.9–10.3)
Chloride: 106 mmol/L (ref 98–111)
Creatinine, Ser: 0.91 mg/dL (ref 0.61–1.24)
GFR calc Af Amer: >60 mL/min (ref >60)
GFR calc non Af Amer: >60 mL/min (ref >60)
Glucose, Bld: 126 mg/dL — ABNORMAL HIGH (ref 70–99)
Potassium: 3.6 mmol/L (ref 3.5–5.1)
SODIUM: 137 mmol/L (ref 135–145)

## 2018-12-16 LAB — CBC
HEMATOCRIT: 45 % (ref 39.0–52.0)
Hemoglobin: 14.4 g/dL (ref 13.0–17.0)
MCH: 25.7 pg — ABNORMAL LOW (ref 26.0–34.0)
MCHC: 32 g/dL (ref 30.0–36.0)
MCV: 80.2 fL (ref 80.0–100.0)
Platelets: 226 10*3/uL (ref 150–400)
RBC: 5.61 MIL/uL (ref 4.22–5.81)
RDW: 13 % (ref 11.5–15.5)
WBC: 6.4 10*3/uL (ref 4.0–10.5)
nRBC: 0 % (ref 0.0–0.2)

## 2018-12-16 LAB — TROPONIN I: Troponin I: <0.03 ng/mL (ref <0.03)

## 2018-12-16 NOTE — ED Triage Notes (Signed)
Pt called from [facility] to treatment room, no response 

## 2018-12-16 NOTE — ED Triage Notes (Signed)
Patient with complaint of left side chest pain with shortness and nausea times two days.

## 2019-10-02 IMAGING — CR DG CHEST 2V
2 series · 2 of 2 positions shown · non-contrast
Comparison: None.

CLINICAL DATA: Left-sided chest pain for 2 days

EXAM:
CHEST - 2 VIEW

[chest pa]
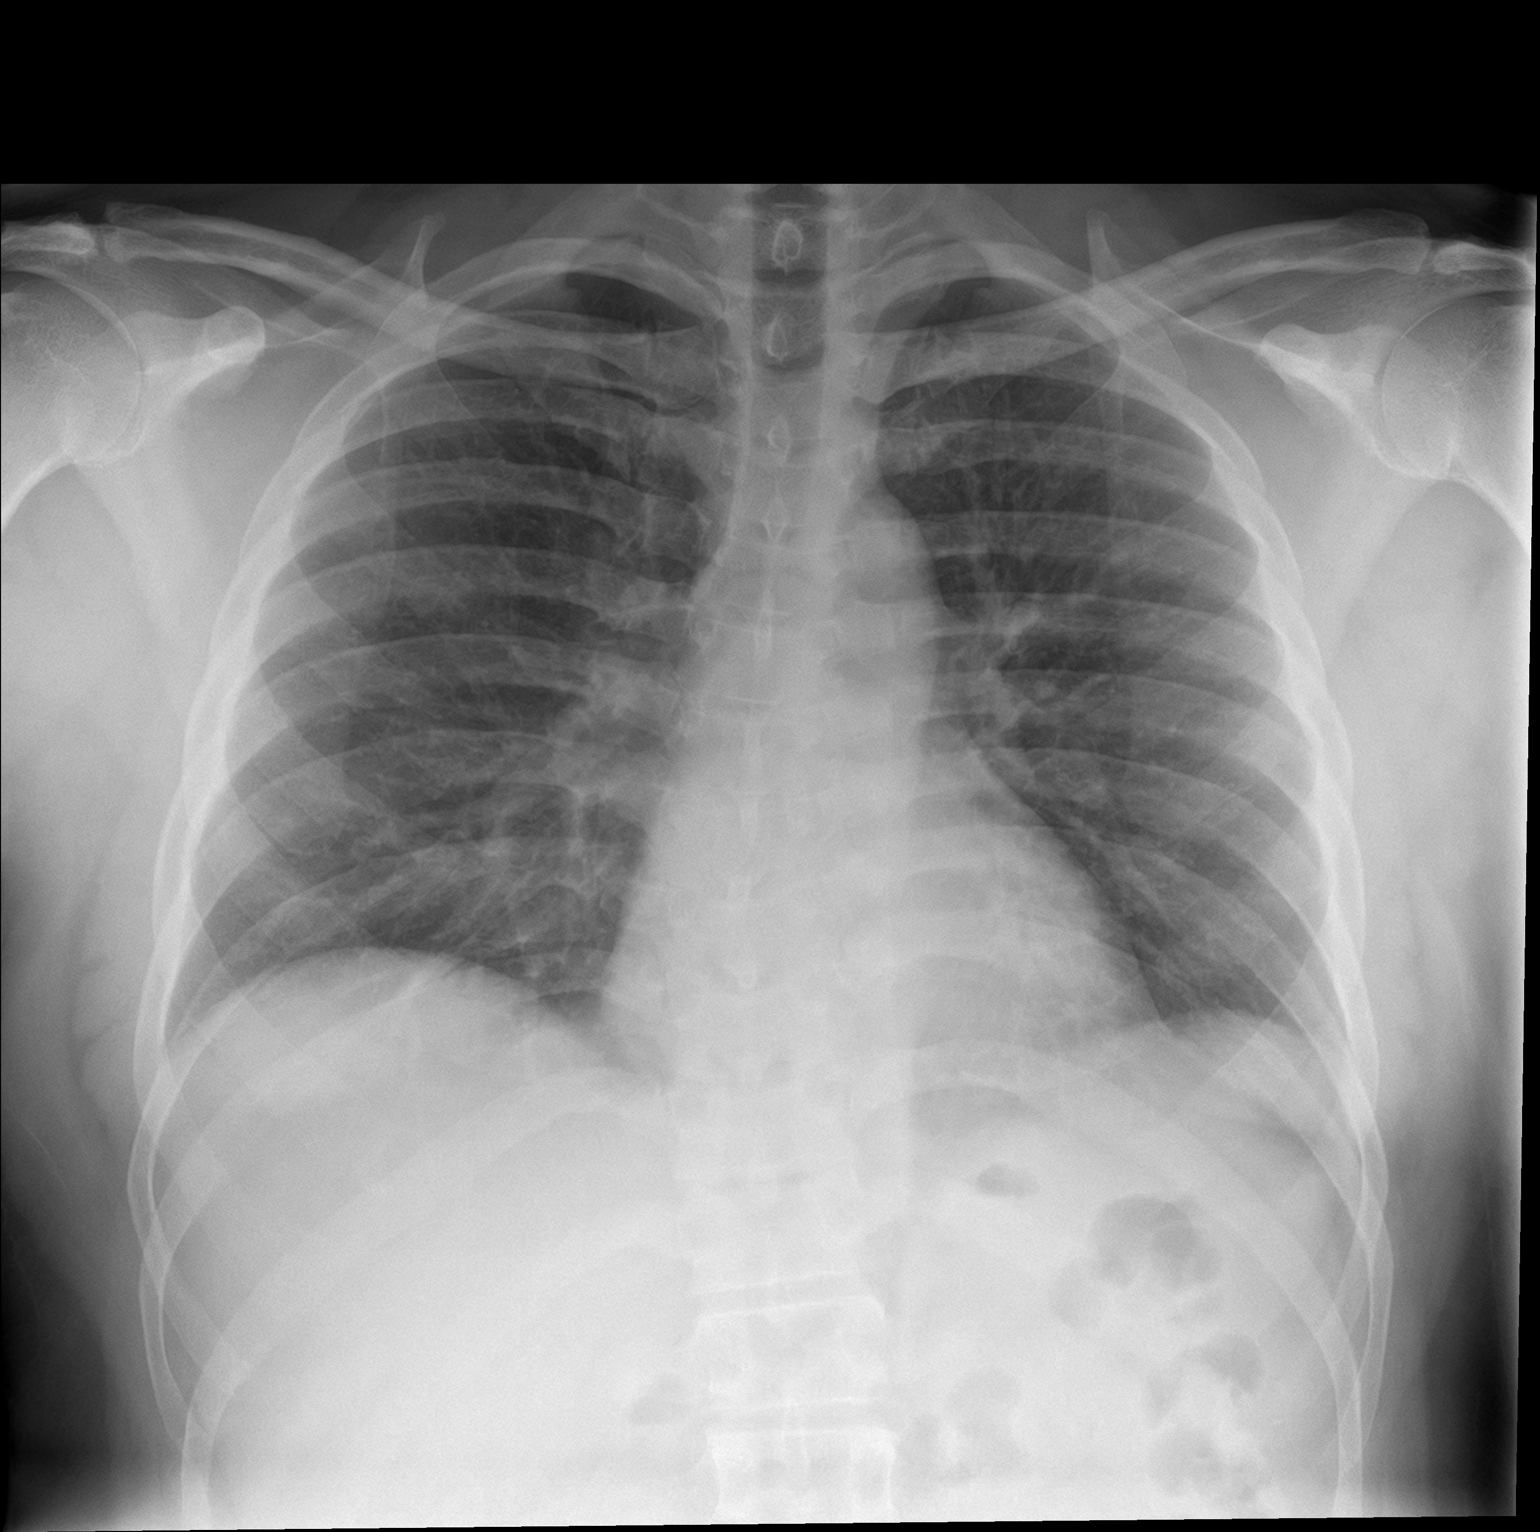

[chest lat]
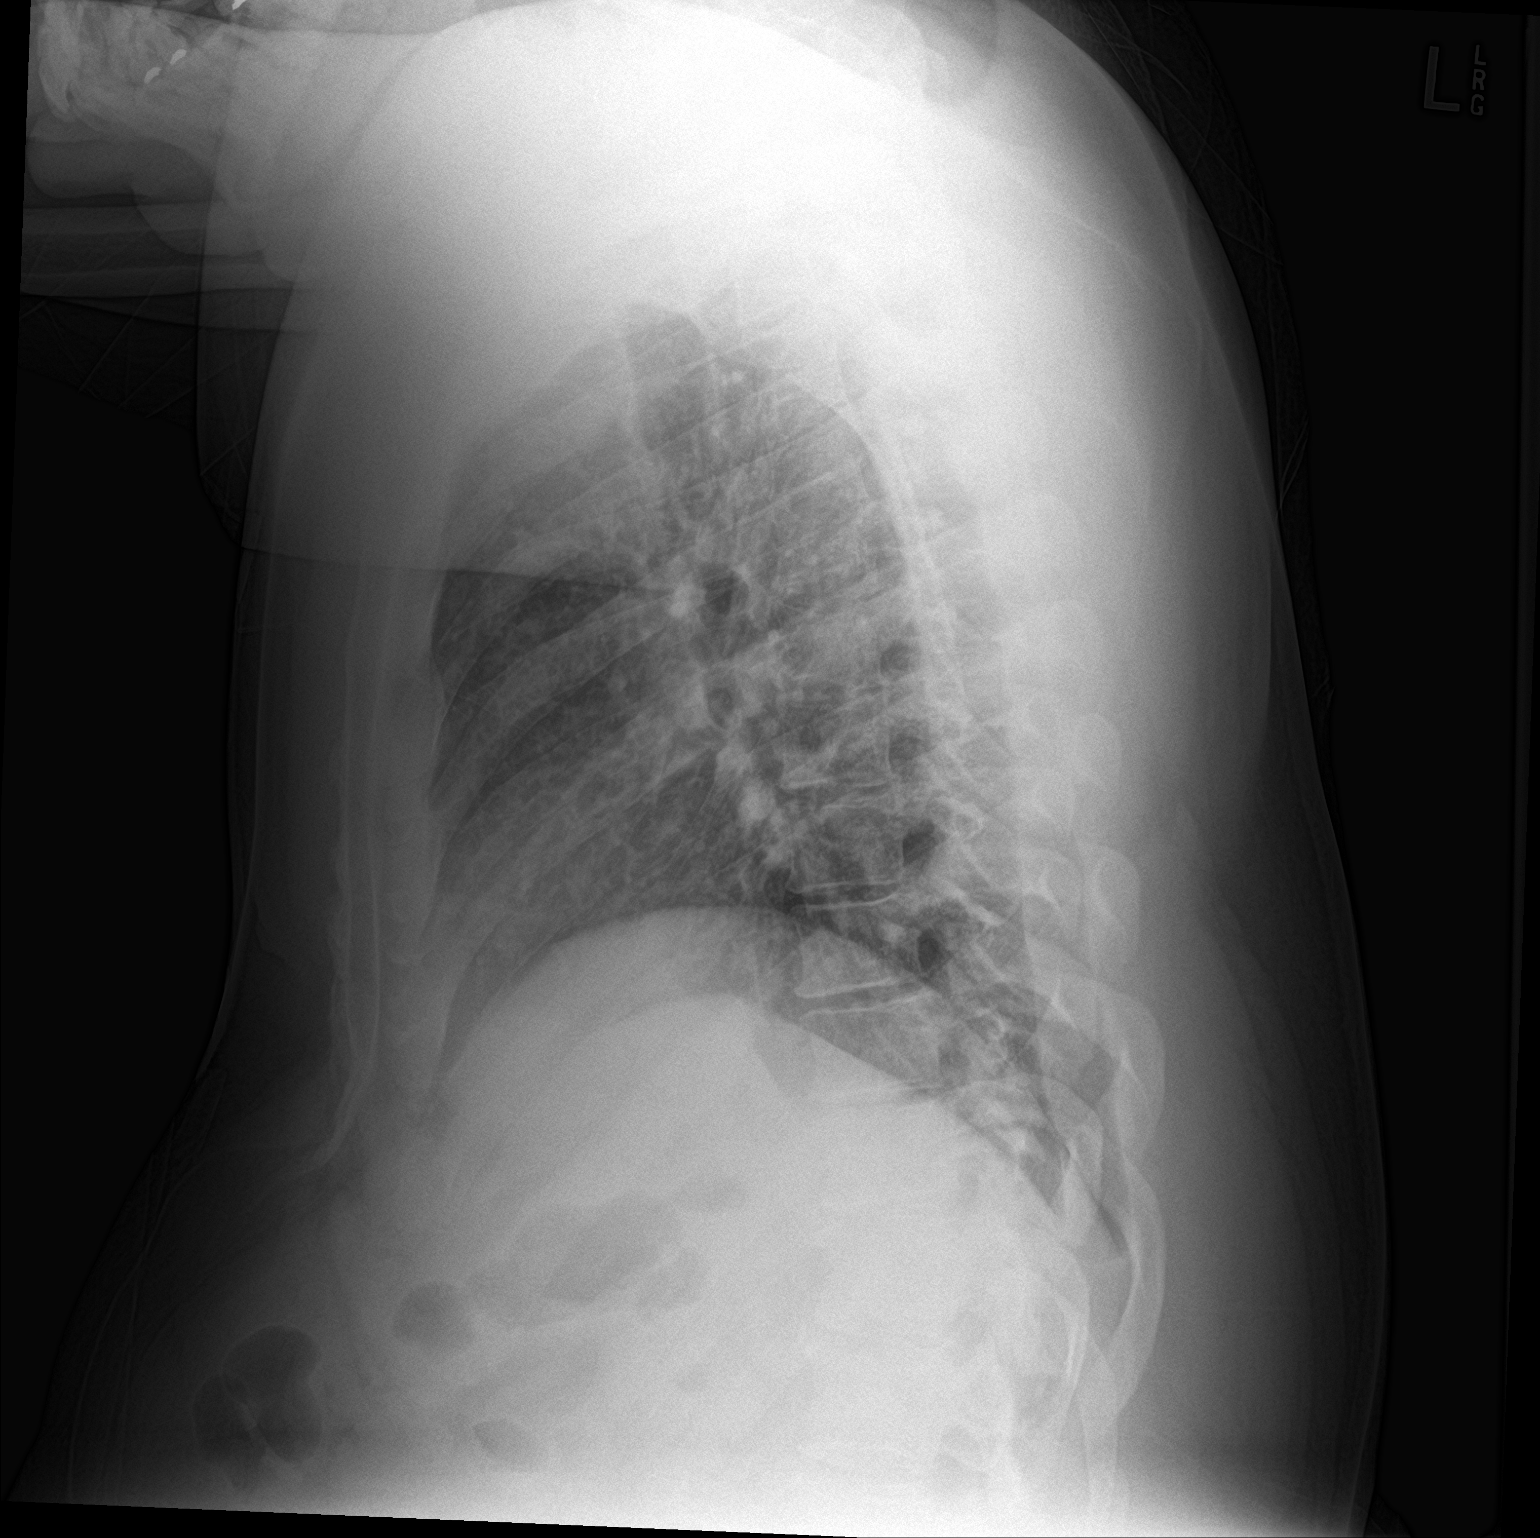

[2 of 2 positions shown; findings below may reference images not displayed]

FINDINGS: Normal heart size and mediastinal contours. No acute infiltrate or
edema. No effusion or pneumothorax. Degenerative disc narrowing the
lower thoracic spine. No acute osseous findings.
IMPRESSION: No evidence of active disease.

## 2021-01-11 ENCOUNTER — Ambulatory Visit (INDEPENDENT_AMBULATORY_CARE_PROVIDER_SITE_OTHER): Payer: BLUE CROSS/BLUE SHIELD

## 2021-01-11 ENCOUNTER — Other Ambulatory Visit: Payer: Self-pay

## 2021-01-11 ENCOUNTER — Ambulatory Visit
Admission: EM | Admit: 2021-01-11 | Discharge: 2021-01-11 | Disposition: A | Discharge status: Home / Self Care | Payer: BLUE CROSS/BLUE SHIELD | Attending: Physician Assistant | Admitting: Physician Assistant

## 2021-01-11 DIAGNOSIS — R519 Headache, unspecified: Secondary | ICD-10-CM

## 2021-01-11 DIAGNOSIS — M542 Cervicalgia: Secondary | ICD-10-CM

## 2021-01-11 DIAGNOSIS — S301XXA Contusion of abdominal wall, initial encounter: Secondary | ICD-10-CM

## 2021-01-11 MED ORDER — TIZANIDINE HCL 4 MG PO TABS: 4.0000 mg | ORAL_TABLET | Freq: Three times a day (TID) | ORAL | 0 refills | Status: AC | PRN

## 2021-01-11 MED ORDER — HYDROCODONE-ACETAMINOPHEN 5-325 MG PO TABS: 1.0000 | ORAL_TABLET | Freq: Four times a day (QID) | ORAL | 0 refills | Status: AC | PRN

## 2021-01-11 NOTE — Discharge Instructions (Signed)
NECK PAIN: X-rays do not show any fractures.  This is likely all pulled muscles.  Stressed avoiding painful activities. This can exacerbate your symptoms and make them worse.  May apply heat to the areas of pain for some relief. Use medications as directed. Be aware of which medications make you drowsy and do not drive or operate any kind of heavy machinery while using the medication (ie pain medications or muscle relaxers). F/U with PCP for reexamination or return sooner if condition worsens or does not begin to improve over the next few days.   NECK PAIN RED FLAGS: If symptoms get worse than they are right now, you should come back sooner for re-evaluation. If you have increased numbness/ tingling or notice that the numbness/tingling is affecting the legs or saddle region, go to ER. If you ever lose continence go to ER.      HEADACHE: You were seen in clinic today for headache. Rest and take meds as directed. If at any point, the headache becomes very severe, is associated with fever, is associated with neck pain/stiffness, you feel like passing out, the headache is different from any you've have had before, there are vision changes/issues with speech/issues with balance, or numbness/weakness in a part of the body, you should be seen urgently or emergently for more serious causes of headache   ABDOMINAL PAIN: Your abdominal exam is likely related to your contusion.  You should ice this area.  Take other medications as given.  You should increase fluids and electrolytes as well as rest over these next several days. If you have any questions or concerns, or if your symptoms are not improving or if especially if they acutely worsen, please call or stop back to the clinic immediately and we will be happy to help you or go to the ER   ABDOMINAL PAIN RED FLAGS: Seek immediate further care if: symptoms remain the same or worsen over the next 3-7 days, you are unable to keep fluids down, you see blood or mucus in  your stool, you vomit black or dark red material, you have a fever of 101.F or higher, you have localized and/or persistent abdominal pain
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
